# Patient Record
Sex: Male | Born: 1965 | Hispanic: No | Marital: Single | Smoking: Never smoker
Health system: Southern US, Community
[De-identification: ages and names within clinical notes are randomized; demographics above are authoritative.]

## PROBLEM LIST (undated history)

## (undated) DIAGNOSIS — I10 Essential (primary) hypertension: Secondary | ICD-10-CM

---

## 2017-12-14 ENCOUNTER — Other Ambulatory Visit: Payer: Self-pay

## 2017-12-14 ENCOUNTER — Emergency Department: Payer: No Typology Code available for payment source

## 2017-12-14 ENCOUNTER — Encounter: Payer: Self-pay | Admitting: Emergency Medicine

## 2017-12-14 ENCOUNTER — Emergency Department
Admission: EM | Admit: 2017-12-14 | Discharge: 2017-12-14 | Disposition: A | Discharge status: Home / Self Care | Payer: No Typology Code available for payment source | Attending: Emergency Medicine | Admitting: Emergency Medicine

## 2017-12-14 DIAGNOSIS — I10 Essential (primary) hypertension: Secondary | ICD-10-CM | POA: Insufficient documentation

## 2017-12-14 DIAGNOSIS — Y9389 Activity, other specified: Secondary | ICD-10-CM | POA: Diagnosis not present

## 2017-12-14 DIAGNOSIS — S20211A Contusion of right front wall of thorax, initial encounter: Secondary | ICD-10-CM | POA: Diagnosis not present

## 2017-12-14 DIAGNOSIS — S199XXA Unspecified injury of neck, initial encounter: Secondary | ICD-10-CM | POA: Diagnosis present

## 2017-12-14 DIAGNOSIS — S8012XA Contusion of left lower leg, initial encounter: Secondary | ICD-10-CM | POA: Diagnosis not present

## 2017-12-14 DIAGNOSIS — Y92411 Interstate highway as the place of occurrence of the external cause: Secondary | ICD-10-CM | POA: Diagnosis not present

## 2017-12-14 DIAGNOSIS — Y999 Unspecified external cause status: Secondary | ICD-10-CM | POA: Diagnosis not present

## 2017-12-14 DIAGNOSIS — S161XXA Strain of muscle, fascia and tendon at neck level, initial encounter: Secondary | ICD-10-CM

## 2017-12-14 DIAGNOSIS — Z760 Encounter for issue of repeat prescription: Secondary | ICD-10-CM | POA: Diagnosis not present

## 2017-12-14 HISTORY — DX: Essential (primary) hypertension: I10

## 2017-12-14 MED ORDER — PREDNISONE 10 MG PO TABS: 10.0000 mg | ORAL_TABLET | Freq: Every day | ORAL | 0 refills | Status: AC

## 2017-12-14 MED ORDER — HYDROCHLOROTHIAZIDE 25 MG PO TABS
ORAL_TABLET | ORAL | Status: AC
  Filled 2017-12-14: qty 1

## 2017-12-14 MED ORDER — KETOROLAC TROMETHAMINE 30 MG/ML IJ SOLN
30.0000 mg | Freq: Once | INTRAMUSCULAR | Status: AC
  Administered 2017-12-14: 30 mg via INTRAMUSCULAR
  Filled 2017-12-14: qty 1

## 2017-12-14 MED ORDER — ORPHENADRINE CITRATE 30 MG/ML IJ SOLN
60.0000 mg | Freq: Two times a day (BID) | INTRAMUSCULAR | Status: DC
  Administered 2017-12-14: 60 mg via INTRAMUSCULAR
  Filled 2017-12-14: qty 2

## 2017-12-14 MED ORDER — HYDROCHLOROTHIAZIDE 25 MG PO TABS: 25.0000 mg | ORAL_TABLET | Freq: Every day | ORAL | Status: DC

## 2017-12-14 MED ORDER — CYCLOBENZAPRINE HCL 5 MG PO TABS: 5.0000 mg | ORAL_TABLET | Freq: Three times a day (TID) | ORAL | 0 refills | Status: AC | PRN

## 2017-12-14 MED ORDER — HYDROCHLOROTHIAZIDE 25 MG PO TABS: 25.0000 mg | ORAL_TABLET | Freq: Every day | ORAL | 0 refills | Status: AC

## 2017-12-14 NOTE — ED Notes (Signed)
Pt ambulatory to POV without difficulty. VSS. NAD. Discharge instructions, RX and follow up reviewed. All questions and concerns addressed.  

## 2017-12-14 NOTE — Discharge Instructions (Addendum)
Please take Flexeril and prednisone as prescribed.  Please restart your blood pressure medication.  Please call your primary care provider and schedule a follow-up appointment within the next couple weeks.  If any increasing pain worsening symptoms or urgent changes in health please return to the emergency department.
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
